# Patient Record
Sex: Female | Born: 1990 | Race: Black or African American | Hispanic: No | Marital: Single | Smoking: Current some day smoker
Health system: Southern US, Community
[De-identification: ages and names within clinical notes are randomized; demographics above are authoritative.]

---

## 2014-08-21 ENCOUNTER — Ambulatory Visit (INDEPENDENT_AMBULATORY_CARE_PROVIDER_SITE_OTHER): Payer: 59 | Admitting: Physician Assistant

## 2014-08-21 VITALS — BP 118/84 | HR 61 | Temp 98.1°F | Resp 16 | Ht 65.5 in | Wt 148.2 lb

## 2014-08-21 DIAGNOSIS — K219 Gastro-esophageal reflux disease without esophagitis: Secondary | ICD-10-CM | POA: Diagnosis not present

## 2014-08-21 DIAGNOSIS — R1013 Epigastric pain: Secondary | ICD-10-CM | POA: Diagnosis not present

## 2014-08-21 LAB — POCT CBC
Granulocyte percent: 48.3 %G (ref 37–80)
HCT, POC: 37.6 % — AB (ref 37.7–47.9)
HEMOGLOBIN: 12.4 g/dL (ref 12.2–16.2)
Lymph, poc: 3.1 (ref 0.6–3.4)
MCH, POC: 28.4 pg (ref 27–31.2)
MCHC: 33 g/dL (ref 31.8–35.4)
MCV: 86.1 fL (ref 80–97)
MID (cbc): 0.5 (ref 0–0.9)
MPV: 7.8 fL (ref 0–99.8)
POC GRANULOCYTE: 3.4 (ref 2–6.9)
POC LYMPH PERCENT: 44.5 %L (ref 10–50)
POC MID %: 7.2 %M (ref 0–12)
Platelet Count, POC: 236 10*3/uL (ref 142–424)
RBC: 4.37 M/uL (ref 4.04–5.48)
RDW, POC: 14.9 %
WBC: 7 10*3/uL (ref 4.6–10.2)

## 2014-08-21 MED ORDER — OMEPRAZOLE 20 MG PO CPDR
20.0000 mg | DELAYED_RELEASE_CAPSULE | Freq: Every day | ORAL | Status: DC
Start: 2014-08-21 — End: 2017-10-02

## 2014-08-21 NOTE — Progress Notes (Signed)
Subjective:    Patient ID: Tina Aguirre, female    DOB: [birth year], 24 y.o.   MRN: [id number]  HPI  This is a 24 year old female who is presenting with 2 months of watery stool and epigastric pain. She states "I think I have a stomach ulcer". She states her abdominal pain is worsened when she is hungry. Pain described as "burning sensation". Feels better when she eats. She has bowel movements every morning. She initially stated that she had "tarry" stool but when asked when she meant she stated "watery". She states sometimes she has normal BMs, sometimes she has watery BMs. No blood in her stool. Takes NSAIDs only when on her period. Denies excessive alcohol use. She smokes cigarettes 2 times per month. Denies fever, chills, N/V. She has never had this problem before. She does not have a history of heartburn.   Review of Systems  Constitutional: Negative for fever and chills.  Cardiovascular: Negative for chest pain.  Gastrointestinal: Positive for abdominal pain and diarrhea. Negative for nausea, vomiting and blood in stool.  Genitourinary: Negative for dysuria and hematuria.  Skin: Negative for rash.  Hematological: Negative for adenopathy.   There are no active problems to display for this patient.  Home meds: none  No Known Allergies  Patient's social and family history were reviewed.     Objective:   Physical Exam  Constitutional: She is oriented to person, place, and time. She appears well-developed and well-nourished. No distress.  HENT:  Head: Normocephalic and atraumatic.  Right Ear: Hearing normal.  Left Ear: Hearing normal.  Nose: Nose normal.  Eyes: Conjunctivae and lids are normal. Right eye exhibits no discharge. Left eye exhibits no discharge. No scleral icterus.  Cardiovascular: Normal rate, regular rhythm, normal heart sounds and normal pulses.   No murmur heard. Pulmonary/Chest: Effort normal and breath sounds normal. No respiratory distress. She has no  wheezes. She has no rhonchi. She has no rales.  Abdominal: Soft. Normal appearance. There is tenderness (mild, epigastric). There is no CVA tenderness and negative Murphy's sign.  Musculoskeletal: Normal range of motion.  Neurological: She is alert and oriented to person, place, and time.  Skin: Skin is warm, dry and intact. No lesion and no rash noted.  Psychiatric: She has a normal mood and affect. Her speech is normal and behavior is normal. Thought content normal.   BP 118/84 mmHg  Pulse 61  Temp(Src) 98.1 F (36.7 C) (Oral)  Resp 16  Ht 5' 5.5" (1.664 m)  Wt 148 lb 4 oz (67.246 kg)  BMI 24.29 kg/m2  SpO2 97%  LMP 08/18/2014  Results for orders placed or performed in visit on 08/21/14  POCT CBC  Result Value Ref Range   WBC 7.0 4.6 - 10.2 K/uL   Lymph, poc 3.1 0.6 - 3.4   POC LYMPH PERCENT 44.5 10 - 50 %L   MID (cbc) 0.5 0 - 0.9   POC MID % 7.2 0 - 12 %M   POC Granulocyte 3.4 2 - 6.9   Granulocyte percent 48.3 37 - 80 %G   RBC 4.37 4.04 - 5.48 M/uL   Hemoglobin 12.4 12.2 - 16.2 g/dL   HCT, POC 40.9 (A) 81.1 - 47.9 %   MCV 86.1 80 - 97 fL   MCH, POC 28.4 27 - 31.2 pg   MCHC 33.0 31.8 - 35.4 g/dL   RDW, POC 91.4 %   Platelet Count, POC 236 142 - 424 K/uL   MPV  7.8 0 - 99.8 fL      Assessment & Plan:  1. Abdominal pain, epigastric 2. GERD Likely that pt has GERD. CBC nml. H. Pylori, CMP and lipase pending. Since symptoms are better with eating it is possible that she has a duodenal ulcer although I am leaning more towards GERD since she has such an unremarkable physical exam. She will take prilosec 20 mg QAM for 2 months to see if she can calm down her symptoms. She will return if not getting better in 2 months.  - H. pylori breath test - Comprehensive metabolic panel - POCT CBC - Lipase - omeprazole (PRILOSEC) 20 MG capsule; Take 1 capsule (20 mg total) by mouth daily.  Dispense: 60 capsule; Refill: 0   Roswell MinersNicole V. Dyke BrackettBush, PA-C, MHS [facility]  08/21/2014

## 2014-08-21 NOTE — Patient Instructions (Signed)
Take prilosec daily 30 minutes before breakfast for next 2 months. I will call you with the results of your lab tests today. Avoid NSAIDs (ibuprofen, advil, motrin, aleve), spicy food, soda, citrus drinks in the meantime. Avoid eating late at night.  Food Choices for Gastroesophageal Reflux Disease When you have gastroesophageal reflux disease (GERD), the foods you eat and your eating habits are very important. Choosing the right foods can help ease the discomfort of GERD. WHAT GENERAL GUIDELINES DO I NEED TO FOLLOW?  Choose fruits, vegetables, whole grains, low-fat dairy products, and low-fat meat, fish, and poultry.  Limit fats such as oils, salad dressings, butter, nuts, and avocado.  Keep a food diary to identify foods that cause symptoms.  Avoid foods that cause reflux. These may be different for different people.  Eat frequent small meals instead of three large meals each day.  Eat your meals slowly, in a relaxed setting.  Limit fried foods.  Cook foods using methods other than frying.  Avoid drinking alcohol.  Avoid drinking large amounts of liquids with your meals.  Avoid bending over or lying down until 2-3 hours after eating. WHAT FOODS ARE NOT RECOMMENDED? The following are some foods and drinks that may worsen your symptoms: Vegetables Tomatoes. Tomato juice. Tomato and spaghetti sauce. Chili peppers. Onion and garlic. Horseradish. Fruits Oranges, grapefruit, and lemon (fruit and juice). Meats High-fat meats, fish, and poultry. This includes hot dogs, ribs, ham, sausage, salami, and bacon. Dairy Whole milk and chocolate milk. Sour cream. Cream. Butter. Ice cream. Cream cheese.  Beverages Coffee and tea, with or without caffeine. Carbonated beverages or energy drinks. Condiments Hot sauce. Barbecue sauce.  Sweets/Desserts Chocolate and cocoa. Donuts. Peppermint and spearmint. Fats and Oils High-fat foods, including [place] fries and potato  chips. Other Vinegar. Strong spices, such as black pepper, white pepper, red pepper, cayenne, curry powder, cloves, ginger, and chili powder. The items listed above may not be a complete list of foods and beverages to avoid. Contact your dietitian for more information. Document Released: 04/11/2005 Document Revised: 04/16/2013 Document Reviewed: 02/13/2013 [facility] Patient Information 2015 [place]. This information is not intended to replace advice given to you by your health care provider. Make sure you discuss any questions you have with your health care provider.

## 2014-08-22 LAB — LIPASE: LIPASE: 37 U/L (ref 0–75)

## 2014-08-22 LAB — COMPREHENSIVE METABOLIC PANEL
ALK PHOS: 41 U/L (ref 39–117)
ALT: 10 U/L (ref 0–35)
AST: 18 U/L (ref 0–37)
Albumin: 3.9 g/dL (ref 3.5–5.2)
BUN: 13 mg/dL (ref 6–23)
CO2: 23 meq/L (ref 19–32)
CREATININE: 0.72 mg/dL (ref 0.50–1.10)
Calcium: 9.4 mg/dL (ref 8.4–10.5)
Chloride: 105 mEq/L (ref 96–112)
Glucose, Bld: 81 mg/dL (ref 70–99)
Potassium: 4.6 mEq/L (ref 3.5–5.3)
Sodium: 140 mEq/L (ref 135–145)
TOTAL PROTEIN: 6.9 g/dL (ref 6.0–8.3)
Total Bilirubin: 0.5 mg/dL (ref 0.2–1.2)

## 2014-08-25 LAB — H. PYLORI BREATH TEST: H. PYLORI BREATH TEST: NOT DETECTED

## 2017-10-02 ENCOUNTER — Emergency Department (HOSPITAL_COMMUNITY): Payer: Self-pay

## 2017-10-02 ENCOUNTER — Emergency Department (HOSPITAL_COMMUNITY)
Admission: EM | Admit: 2017-10-02 | Discharge: 2017-10-02 | Disposition: A | Discharge status: Home / Self Care | Payer: Self-pay | Attending: Emergency Medicine | Admitting: Emergency Medicine

## 2017-10-02 ENCOUNTER — Encounter (HOSPITAL_COMMUNITY): Payer: Self-pay | Admitting: Emergency Medicine

## 2017-10-02 DIAGNOSIS — O200 Threatened abortion: Secondary | ICD-10-CM | POA: Insufficient documentation

## 2017-10-02 DIAGNOSIS — O26891 Other specified pregnancy related conditions, first trimester: Secondary | ICD-10-CM | POA: Insufficient documentation

## 2017-10-02 DIAGNOSIS — F17200 Nicotine dependence, unspecified, uncomplicated: Secondary | ICD-10-CM | POA: Insufficient documentation

## 2017-10-02 DIAGNOSIS — O99331 Smoking (tobacco) complicating pregnancy, first trimester: Secondary | ICD-10-CM | POA: Insufficient documentation

## 2017-10-02 DIAGNOSIS — R109 Unspecified abdominal pain: Secondary | ICD-10-CM | POA: Insufficient documentation

## 2017-10-02 DIAGNOSIS — R1030 Lower abdominal pain, unspecified: Secondary | ICD-10-CM | POA: Insufficient documentation

## 2017-10-02 LAB — COMPREHENSIVE METABOLIC PANEL
ALBUMIN: 4.1 g/dL (ref 3.5–5.0)
ALT: 15 U/L (ref 14–54)
ANION GAP: 6 (ref 5–15)
AST: 21 U/L (ref 15–41)
Alkaline Phosphatase: 47 U/L (ref 38–126)
BILIRUBIN TOTAL: 0.4 mg/dL (ref 0.3–1.2)
BUN: 12 mg/dL (ref 6–20)
CO2: 26 mmol/L (ref 22–32)
Calcium: 9.1 mg/dL (ref 8.9–10.3)
Chloride: 107 mmol/L (ref 101–111)
Creatinine, Ser: 0.75 mg/dL (ref 0.44–1.00)
GFR calc Af Amer: >60 mL/min (ref >60)
GLUCOSE: 88 mg/dL (ref 65–99)
POTASSIUM: 4.3 mmol/L (ref 3.5–5.1)
Sodium: 139 mmol/L (ref 135–145)
TOTAL PROTEIN: 7.2 g/dL (ref 6.5–8.1)

## 2017-10-02 LAB — POC URINE PREG, ED: Preg Test, Ur: POSITIVE — AB

## 2017-10-02 LAB — WET PREP, GENITAL
Sperm: NONE SEEN
Trich, Wet Prep: NONE SEEN
Yeast Wet Prep HPF POC: NONE SEEN

## 2017-10-02 LAB — CBC
HEMATOCRIT: 37.4 % (ref 36.0–46.0)
HEMOGLOBIN: 12.1 g/dL (ref 12.0–15.0)
MCH: 28.1 pg (ref 26.0–34.0)
MCHC: 32.4 g/dL (ref 30.0–36.0)
MCV: 87 fL (ref 78.0–100.0)
Platelets: 217 10*3/uL (ref 150–400)
RBC: 4.3 MIL/uL (ref 3.87–5.11)
RDW: 14.9 % (ref 11.5–15.5)
WBC: 6.3 10*3/uL (ref 4.0–10.5)

## 2017-10-02 LAB — URINALYSIS, ROUTINE W REFLEX MICROSCOPIC
BILIRUBIN URINE: NEGATIVE
Glucose, UA: NEGATIVE mg/dL
Ketones, ur: 5 mg/dL — AB
NITRITE: NEGATIVE
PROTEIN: 30 mg/dL — AB
Specific Gravity, Urine: 1.026 (ref 1.005–1.030)
pH: 5 (ref 5.0–8.0)

## 2017-10-02 LAB — HCG, QUANTITATIVE, PREGNANCY: hCG, Beta Chain, Quant, S: 432 m[IU]/mL — ABNORMAL HIGH (ref <5)

## 2017-10-02 LAB — ABO/RH: ABO/RH(D): B POS

## 2017-10-02 LAB — LIPASE, BLOOD: Lipase: 32 U/L (ref 11–51)

## 2017-10-02 MED ORDER — SODIUM CHLORIDE 0.9 % IV BOLUS: 1000.0000 mL | Freq: Once | INTRAVENOUS | Status: DC

## 2017-10-02 NOTE — ED Triage Notes (Signed)
Per pt, states her last period was 08/16/2017-states she woke up yesterday with abdominal pain and cramping with some vaginal bleeding-states now heavy bleeding with clots-states abdominal pain/cramping relieved with Advil -states she thinks she is having a miscarriage

## 2017-10-02 NOTE — ED Provider Notes (Signed)
[facility]-EMERGENCY DEPT Provider Note   CSN: [id number] Arrival date & time: 10/02/17  1106     History   Chief Complaint Chief Complaint  Patient presents with  . Vaginal Bleeding    HPI Tina Aguirre is a 27 y.o. female presents today for evaluation of acute onset, progressively worsening vaginal bleeding since yesterday as well as abdominal pain.  She notes that her last menstrual period was 08/16/2017.  It lasted approximate 5 days and was normal in quality and duration for her.  She took a pregnancy test 3 weeks ago which was positive.  She also went to a women's clinic 2 weeks ago that told her that she was pregnant.  They did not perform an ultrasound at that time. They told her they estimated she may have been around [redacted] weeks pregnant. She states that yesterday when she went to use the bathroom to urinate she passed a large blood clot.  This happened a second time yesterday.  This morning when she awoke she noted heavy vaginal bleeding.  She notes acute onset of lower abdominal cramping which radiates to the back bilaterally.  No aggravating factors noted.  Pain has been alleviated with ibuprofen.  She endorses nausea but no vomiting.  She denies urinary symptoms, fevers, chills, chest pain, shortness of breath, diarrhea, constipation, melena, hematochezia, or hematuria.  The history is provided by the patient.    History reviewed. No pertinent past medical history.  There are no active problems to display for this patient.   History reviewed. No pertinent surgical history.   OB History   None      Home Medications    Prior to Admission medications   Not on File    Family History Family History  Problem Relation Age of Onset  . Cancer Paternal Grandmother   . Cancer Paternal Grandfather     Social History Social History   Tobacco Use  . Smoking status: Current Some Day Smoker  . Smokeless tobacco: Never Used  Substance Use Topics    . Alcohol use: Yes    Alcohol/week: 0.0 oz    Comment: social use  . Drug use: No     Allergies   Patient has no known allergies.   Review of Systems Review of Systems  Constitutional: Negative for chills and fever.  Respiratory: Negative for shortness of breath.   Cardiovascular: Negative for chest pain.  Gastrointestinal: Positive for abdominal pain and nausea. Negative for constipation, diarrhea and vomiting.  Genitourinary: Positive for vaginal bleeding. Negative for dysuria, hematuria, vaginal discharge and vaginal pain.  All other systems reviewed and are negative.    Physical Exam Updated Vital Signs BP 116/61 (BP Location: Right Arm)   Pulse 79   Temp 98.9 F (37.2 C) (Oral)   Resp 16   Ht 5\' 6"  (1.676 m)   Wt 71.2 kg (157 lb)   LMP 08/16/2017   SpO2 100%   BMI 25.34 kg/m   Physical Exam  Constitutional: She appears well-developed and well-nourished. No distress.  HENT:  Head: Normocephalic and atraumatic.  Eyes: Conjunctivae are normal. Right eye exhibits no discharge. Left eye exhibits no discharge.  Neck: No JVD present. No tracheal deviation present.  Cardiovascular: Normal rate, regular rhythm and normal heart sounds.  Pulmonary/Chest: Effort normal and breath sounds normal.  Abdominal: Soft. Bowel sounds are normal. She exhibits no distension. There is tenderness. There is no guarding.  Tenderness to palpation in the suprapubic region.  No  CVA tenderness, Murphy sign absent, Rovsing's absent  Genitourinary:  Genitourinary Comments: Examination performed in the presence of chaperone.  No masses or lesions to the external genitalia.  There is copious blood in the vaginal vault.  It is difficult to visualize the cervix and I am unable to visualize if cervical os is open.  No cervical motion tenderness or adnexal tenderness.  Uterus is soft and mildly tender.  Musculoskeletal: Normal range of motion. She exhibits no edema or tenderness.  No midline spine  TTP, no paraspinal muscle tenderness, no deformity, crepitus, or step-off noted   Neurological: She is alert.  Skin: Skin is warm and dry. No erythema.  Psychiatric: She has a normal mood and affect. Her behavior is normal.  Nursing note and vitals reviewed.    ED Treatments / Results  Labs (all labs ordered are listed, but only abnormal results are displayed) Labs Reviewed  WET PREP, GENITAL - Abnormal; Notable for the following components:      Result Value   Clue Cells Wet Prep HPF POC PRESENT (*)    WBC, Wet Prep HPF POC MANY (*)    All other components within normal limits  URINALYSIS, ROUTINE W REFLEX MICROSCOPIC - Abnormal; Notable for the following components:   APPearance HAZY (*)    Hgb urine dipstick LARGE (*)    Ketones, ur 5 (*)    Protein, ur 30 (*)    Leukocytes, UA MODERATE (*)    Bacteria, UA RARE (*)    All other components within normal limits  HCG, QUANTITATIVE, PREGNANCY - Abnormal; Notable for the following components:   hCG, Beta Chain, Quant, S 432 (*)    All other components within normal limits  POC URINE PREG, ED - Abnormal; Notable for the following components:   Preg Test, Ur POSITIVE (*)    All other components within normal limits  COMPREHENSIVE METABOLIC PANEL  CBC  LIPASE, BLOOD  RPR  HIV ANTIBODY (ROUTINE TESTING)  ABO/RH  GC/CHLAMYDIA PROBE AMP ([place]) NOT AT [facility]    EKG None  Radiology US Ob Comp < 14 Wks  Result Date: 10/02/2017 CLINICAL DATA:  Vaginal bleeding.  Pregnant patient. EXAM: OBSTETRIC <14 WK [place] AND TRANSVAGINAL OB US TECHNIQUE: Both transabdominal and transvaginal ultrasound examinations were performed for complete evaluation of the gestation as well as the maternal uterus, adnexal regions, and pelvic cul-de-sac. Transvaginal technique was performed to assess early pregnancy. COMPARISON:  None. FINDINGS: Intrauterine gestational sac: There is an oval fluid collection within the cervix with a mean diameter of 4.9 mm.  Maternal uterus/adnexae: There is a corpus luteum cyst on the right. The left ovary is normal. A small amount of fluid in the cul-de-sac is likely physiologic. IMPRESSION: There is a small oval fluid collection within the cervix. This is nonspecific. A passing early IUP is not excluded. However, no definitive IUP. The lack of a definitive IUP with a positive pregnancy test could be seen with ectopic pregnancy, recent miscarriage, or early pregnancy. Recommend clinical correlation and close clinical/imaging follow-up as warranted. Electronically Signed   By: Gerome Sam III M.D   On: 10/02/2017 17:06   US Ob Transvaginal  Result Date: 10/02/2017 CLINICAL DATA:  Vaginal bleeding.  Pregnant patient. EXAM: OBSTETRIC <14 WK [place] AND TRANSVAGINAL OB US TECHNIQUE: Both transabdominal and transvaginal ultrasound examinations were performed for complete evaluation of the gestation as well as the maternal uterus, adnexal regions, and pelvic cul-de-sac. Transvaginal technique was performed to assess early pregnancy. COMPARISON:  None. FINDINGS: Intrauterine gestational sac: There is an oval fluid collection within the cervix with a mean diameter of 4.9 mm. Maternal uterus/adnexae: There is a corpus luteum cyst on the right. The left ovary is normal. A small amount of fluid in the cul-de-sac is likely physiologic. IMPRESSION: There is a small oval fluid collection within the cervix. This is nonspecific. A passing early IUP is not excluded. However, no definitive IUP. The lack of a definitive IUP with a positive pregnancy test could be seen with ectopic pregnancy, recent miscarriage, or early pregnancy. Recommend clinical correlation and close clinical/imaging follow-up as warranted. Electronically Signed   By: Gerome Sam III M.D   On: 10/02/2017 17:06    Procedures Procedures (including critical care time)  Medications Ordered in ED Medications - No data to display   Initial Impression / Assessment and Plan  / ED Course  I have reviewed the triage vital signs and the nursing notes.  Pertinent labs & imaging results that were available during my care of the patient were reviewed by me and considered in my medical decision making (see chart for details).     Patient presents for evaluation of vaginal bleeding.  She had a positive pregnancy test 3 weeks ago and estimates she may be [redacted] weeks pregnant.  She is afebrile, vital signs are stable.  She is nontoxic in appearance.  She has suprapubic tenderness to palpation.  She has copious blood in the vaginal vault.  Concern for possible spontaneous abortion, will require ultrasound to rule out ectopic pregnancy.  Lab work reviewed by me shows no leukocytosis, no anemia, no significant electrolyte abnormalities.  Beta hCG is elevated at 432 but this is not consistent with a pregnancy with gestation of around 6 weeks.  UA shows rare bacteria and small amount of pyuria but is not a clean-catch.  Unlikely to be UTI, doubt nephrolithiasis.  Patient has no urinary symptoms.  Wet prep does show clue cells though this is likely physiologic and the patient is not complaining of abnormal discharge.  Pelvic ultrasound shows a small oval fluid collection within the cervix, no definite IUP.  I spoke with Dr. Vergie Living with OB/GYN who has a low suspicion of ectopic pregnancy given patient stable condition and mild pain.  I doubt obstruction, perforation, appendicitis, colitis, ovarian torsion, TOA, PID, or other acute surgical abdominal pathology.  Presentation and work-up are consistent with threatened miscarriage.  Dr. Vergie Living recommends obtaining ABO/Rh but patient does not need to stay in the ED for the results.  She will follow-up in the women's outpatient clinic on Wednesday for repeat beta-hCG and possible administration of RhoGam dependent on patient's blood type.  On reevaluation, the patient is resting comfortably no apparent distress.  Serial abdominal examinations  remain benign.  She is tolerating p.o. food and fluids without difficulty.  She understands to follow up with Dr. Vergie Living in 48 hours for reevaluation.  Discussed strict ED precautions. Pt verbalized understanding of and agreement with plan and is safe for discharge home at this time.  She has no complaints prior to discharge.  Final Clinical Impressions(s) / ED Diagnoses   Final diagnoses:  Threatened miscarriage    ED Discharge Orders    None       Bennye Alm 10/04/17 1654    Wynetta Fines, MD 10/04/17 2325

## 2017-10-02 NOTE — Discharge Instructions (Addendum)
You may take 500 to 1000 mg of Tylenol every 6 hours as needed for pain.  He may apply a heating pad 20 minutes on 20 minutes off to the abdomen or low back for comfort.  Call the OB/GYN office at the phone number below tomorrow to schedule a follow-up appointment for Wednesday.  They will take blood work that we will recheck your pregnancy hormone levels.  They may also give you medication  called RhoGam depending on what your blood type is.  It is very important that you go to this appointment on Wednesday.  Return to the emergency department immediately for any concerning signs or symptoms develop such as worsening abdominal pain, fevers, worsening bleeding, persistent vomiting, or passing out.

## 2017-10-02 NOTE — ED Notes (Signed)
Spoke to blood bank to verify they just want a ABO. Amanda,RN aware.

## 2017-10-03 ENCOUNTER — Telehealth: Payer: Self-pay | Admitting: General Practice

## 2017-10-03 LAB — RPR: RPR Ser Ql: NONREACTIVE

## 2017-10-03 LAB — HIV ANTIBODY (ROUTINE TESTING W REFLEX): HIV Screen 4th Generation wRfx: NONREACTIVE

## 2017-10-03 LAB — GC/CHLAMYDIA PROBE AMP (~~LOC~~) NOT AT ARMC
Chlamydia: NEGATIVE
Neisseria Gonorrhea: NEGATIVE

## 2017-10-03 NOTE — Telephone Encounter (Signed)
Called patient and notified of appointment for stat repeat beta hcg on 10/04/17 at 1:30pm.  Patient verbalized understanding.

## 2017-10-04 ENCOUNTER — Ambulatory Visit (INDEPENDENT_AMBULATORY_CARE_PROVIDER_SITE_OTHER): Payer: Self-pay | Admitting: General Practice

## 2017-10-04 DIAGNOSIS — O3680X0 Pregnancy with inconclusive fetal viability, not applicable or unspecified: Secondary | ICD-10-CM

## 2017-10-04 DIAGNOSIS — O039 Complete or unspecified spontaneous abortion without complication: Secondary | ICD-10-CM

## 2017-10-04 DIAGNOSIS — O283 Abnormal ultrasonic finding on antenatal screening of mother: Secondary | ICD-10-CM

## 2017-10-04 LAB — HCG, QUANTITATIVE, PREGNANCY: hCG, Beta Chain, Quant, S: 119 m[IU]/mL — ABNORMAL HIGH (ref <5)

## 2017-10-04 NOTE — Progress Notes (Addendum)
Patient presents to office today for stat bhcg. Patient reports continued bleeding like a period and off/on cramps. Discussed with patient we are monitoring her bhcg levels today & asked she wait in lobby for results/updated plan of care. Patient verbalized understanding & had no questions at this time.  Reviewed results with Nolene Bernheimerri Burleson who finds decreasing bhcg levels consistent with SAB. Patient should have follow up bhcg in 1 week & provider visit in 2 weeks.  Informed patient of results & discussed recommended follow ups. Patient verbalized understanding to all & had no questions.  I have reviewed the labs, ultrasound and note.  I agree with the documentation and the plan of care. Nolene BernheimERRI BURLESON, RN, MSN, NP-BC Nurse Practitioner, [facility], [facility] 10/04/2017 4:26 PM

## 2018-11-09 IMAGING — US US OB COMP LESS 14 WK
1 series · 14 of 28 positions shown · non-contrast
Comparison: None.

CLINICAL DATA: Vaginal bleeding.  Pregnant patient.

EXAM:
OBSTETRIC <14 WK US AND TRANSVAGINAL OB US
TECHNIQUE: Both transabdominal and transvaginal ultrasound examinations were
performed for complete evaluation of the gestation as well as the
maternal uterus, adnexal regions, and pelvic cul-de-sac.
Transvaginal technique was performed to assess early pregnancy.

[Series 1: us ob comp less 14 wk · 14 of 179 slices shown]
[im 7/179]
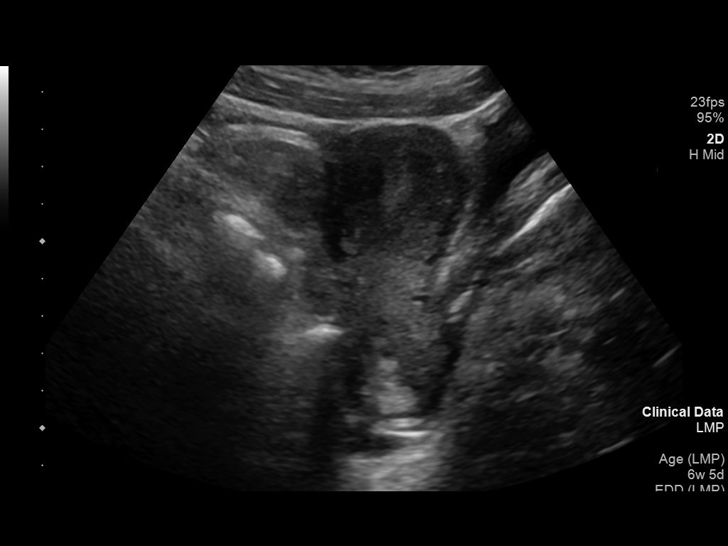
[im 20/179]
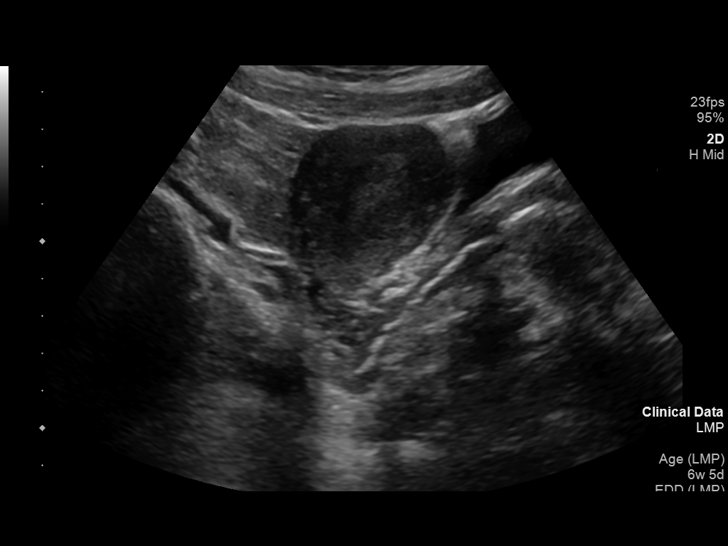
[im 33/179]
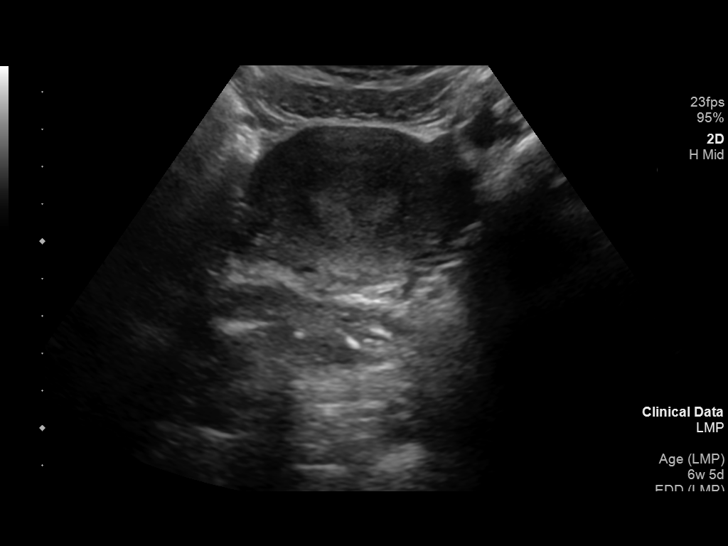
[im 47/179]
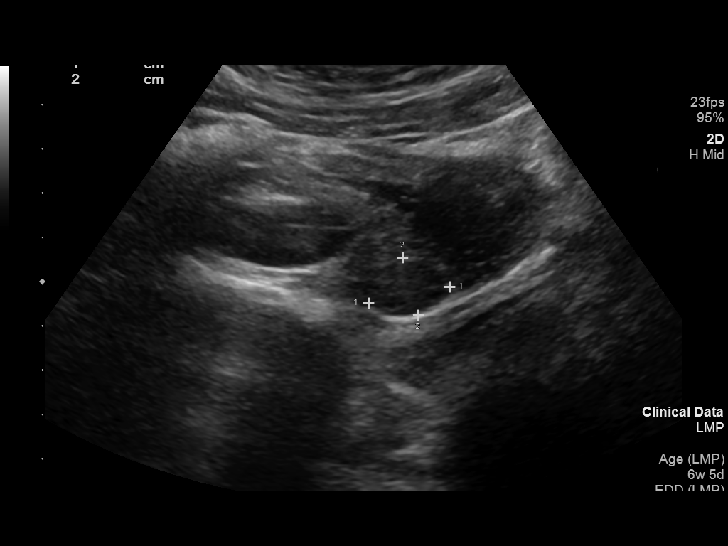
[im 60/179]
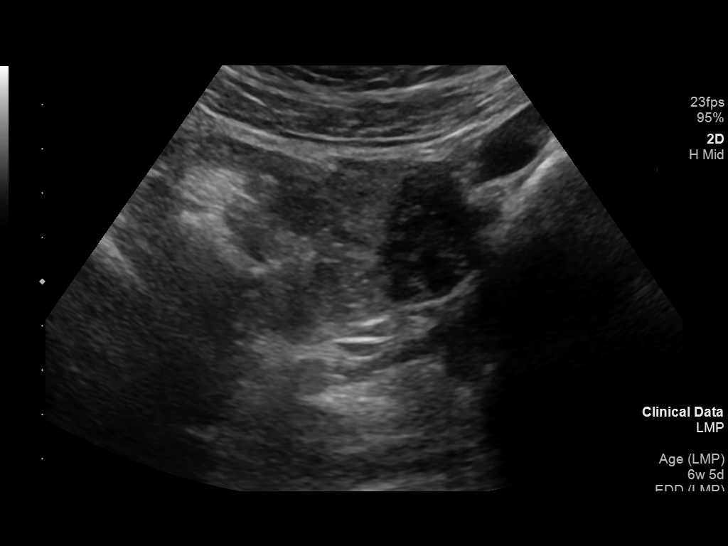
[im 73/179]
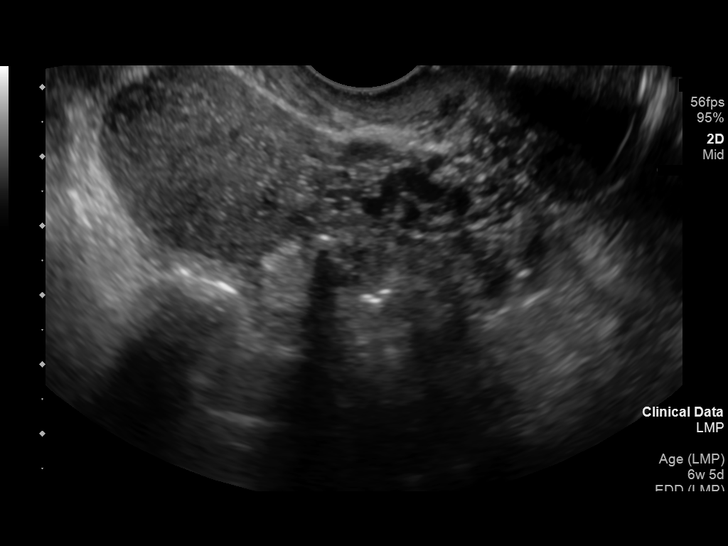
[im 86/179]
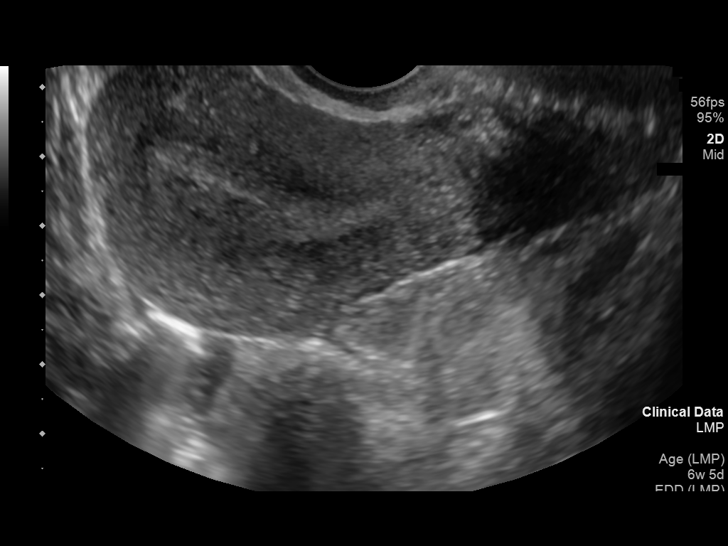
[im 99/179]
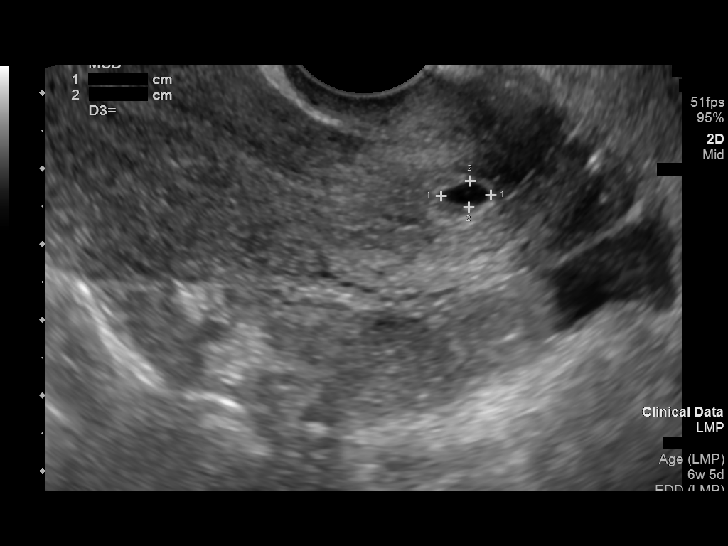
[im 113/179]
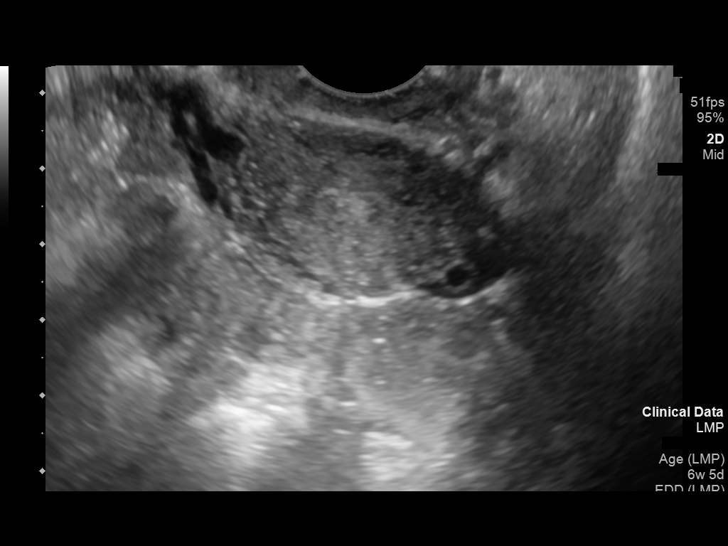
[im 126/179]
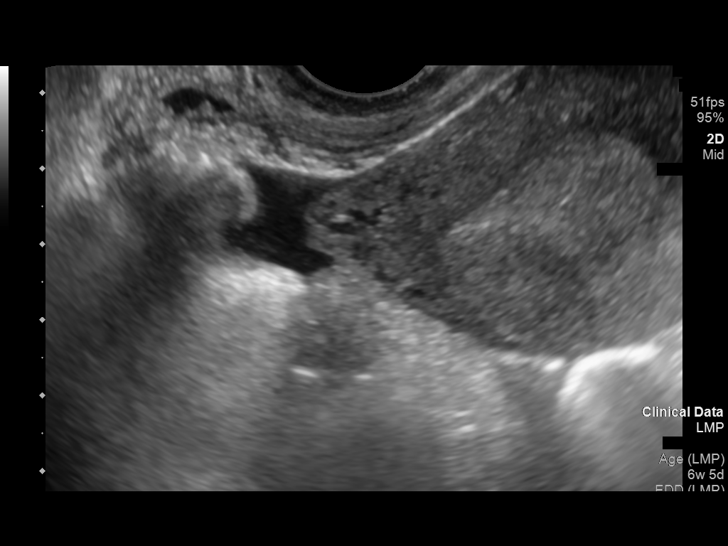
[im 139/179]
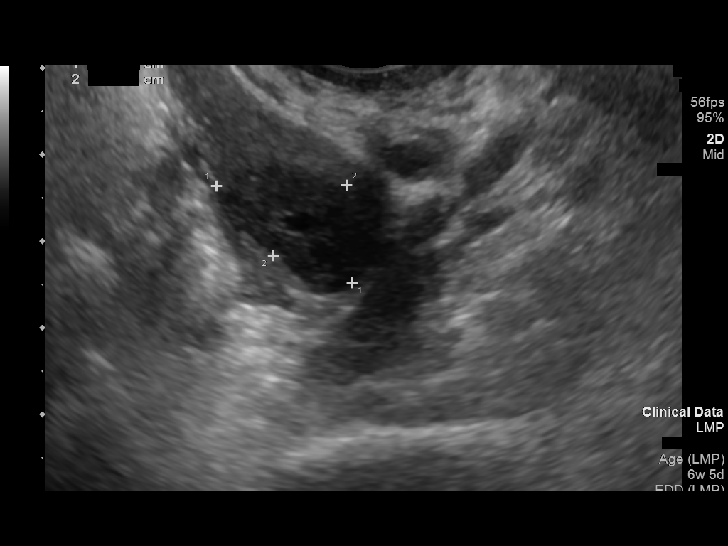
[im 152/179]
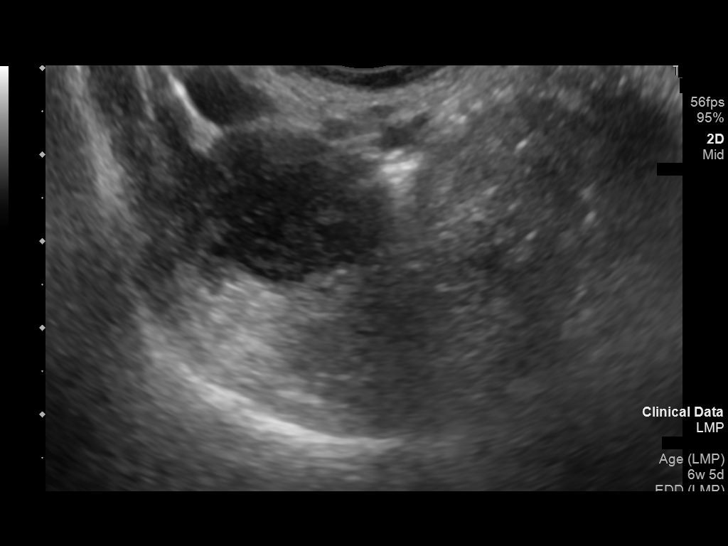
[im 165/179]
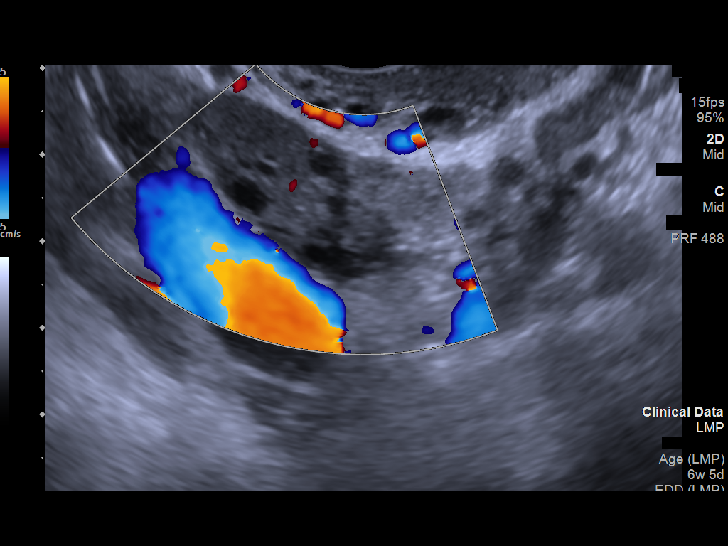
[im 179/179]
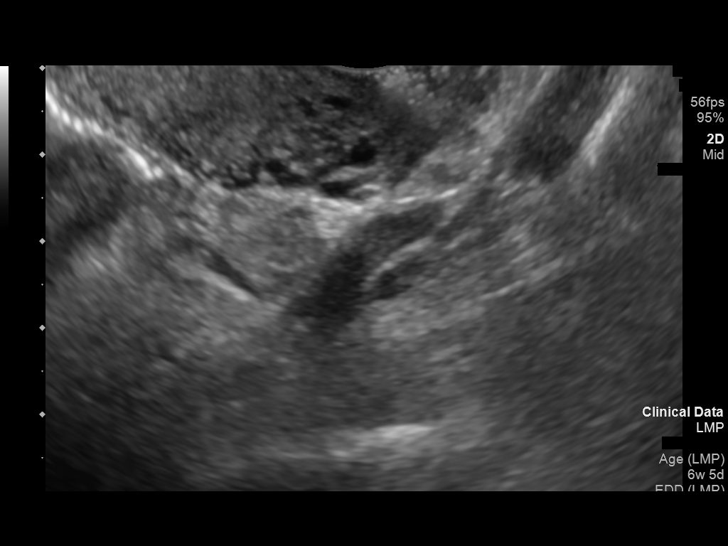

[14 of 28 positions shown; findings below may reference images not displayed]

FINDINGS: Intrauterine gestational sac: There is an oval fluid collection
within the cervix with a mean diameter of 4.9 mm.

Maternal uterus/adnexae: There is a corpus luteum cyst on the right.
The left ovary is normal. A small amount of fluid in the cul-de-sac
is likely physiologic.
IMPRESSION: There is a small oval fluid collection within the cervix. This is
nonspecific. A passing early IUP is not excluded. However, no
definitive IUP. The lack of a definitive IUP with a positive
pregnancy test could be seen with ectopic pregnancy, recent
miscarriage, or early pregnancy. Recommend clinical correlation and
close clinical/imaging follow-up as warranted.
